# Patient Record
Sex: Male | Born: 1935 | Race: White | Hispanic: No | Marital: Married | Smoking: Never smoker
Health system: Southern US, Community
[De-identification: ages and names within clinical notes are randomized; demographics above are authoritative.]

## PROBLEM LIST (undated history)

## (undated) DIAGNOSIS — I10 Essential (primary) hypertension: Secondary | ICD-10-CM

## (undated) DIAGNOSIS — K5792 Diverticulitis of intestine, part unspecified, without perforation or abscess without bleeding: Secondary | ICD-10-CM

## (undated) DIAGNOSIS — H9190 Unspecified hearing loss, unspecified ear: Secondary | ICD-10-CM

## (undated) DIAGNOSIS — Z8719 Personal history of other diseases of the digestive system: Secondary | ICD-10-CM

## (undated) DIAGNOSIS — I499 Cardiac arrhythmia, unspecified: Secondary | ICD-10-CM

## (undated) HISTORY — PX: MOHS SURGERY: SHX181

## (undated) HISTORY — PX: COLONOSCOPY: SHX174

## (undated) HISTORY — PX: CHOLECYSTECTOMY: SHX55

---

## 2005-08-26 ENCOUNTER — Ambulatory Visit: Payer: Self-pay | Admitting: Unknown Physician Specialty

## 2006-12-14 ENCOUNTER — Ambulatory Visit: Payer: Self-pay | Admitting: Internal Medicine

## 2006-12-22 ENCOUNTER — Ambulatory Visit: Payer: Self-pay | Admitting: Internal Medicine

## 2007-06-15 ENCOUNTER — Ambulatory Visit: Payer: Self-pay | Admitting: Internal Medicine

## 2008-11-29 ENCOUNTER — Ambulatory Visit: Payer: Self-pay | Admitting: Cardiology

## 2008-12-06 ENCOUNTER — Ambulatory Visit: Payer: Self-pay | Admitting: Internal Medicine

## 2008-12-12 ENCOUNTER — Ambulatory Visit: Payer: Self-pay | Admitting: Cardiology

## 2014-01-09 ENCOUNTER — Emergency Department: Payer: Self-pay | Admitting: Emergency Medicine

## 2014-01-09 LAB — PROTIME-INR
INR: 2.3
Prothrombin Time: 25 secs — ABNORMAL HIGH (ref 11.5–14.7)

## 2014-01-17 ENCOUNTER — Ambulatory Visit: Payer: Self-pay | Admitting: Otolaryngology

## 2016-04-16 ENCOUNTER — Emergency Department: Payer: Medicare Other

## 2016-04-16 ENCOUNTER — Encounter: Payer: Self-pay | Admitting: *Deleted

## 2016-04-16 ENCOUNTER — Emergency Department
Admission: EM | Admit: 2016-04-16 | Discharge: 2016-04-16 | Disposition: A | Discharge status: Home / Self Care | Payer: Medicare Other | Attending: Emergency Medicine | Admitting: Emergency Medicine

## 2016-04-16 DIAGNOSIS — I10 Essential (primary) hypertension: Secondary | ICD-10-CM | POA: Insufficient documentation

## 2016-04-16 DIAGNOSIS — R079 Chest pain, unspecified: Secondary | ICD-10-CM

## 2016-04-16 DIAGNOSIS — Z79899 Other long term (current) drug therapy: Secondary | ICD-10-CM | POA: Diagnosis not present

## 2016-04-16 DIAGNOSIS — R0781 Pleurodynia: Secondary | ICD-10-CM | POA: Insufficient documentation

## 2016-04-16 DIAGNOSIS — Z7901 Long term (current) use of anticoagulants: Secondary | ICD-10-CM | POA: Diagnosis not present

## 2016-04-16 DIAGNOSIS — R0602 Shortness of breath: Secondary | ICD-10-CM | POA: Diagnosis present

## 2016-04-16 HISTORY — DX: Essential (primary) hypertension: I10

## 2016-04-16 LAB — BASIC METABOLIC PANEL
ANION GAP: 10 (ref 5–15)
BUN: 17 mg/dL (ref 6–20)
CALCIUM: 9.2 mg/dL (ref 8.9–10.3)
CHLORIDE: 102 mmol/L (ref 101–111)
CO2: 29 mmol/L (ref 22–32)
CREATININE: 1.04 mg/dL (ref 0.61–1.24)
GLUCOSE: 178 mg/dL — AB (ref 65–99)
Potassium: 3.4 mmol/L — ABNORMAL LOW (ref 3.5–5.1)
Sodium: 141 mmol/L (ref 135–145)

## 2016-04-16 LAB — CBC
HCT: 43.7 % (ref 40.0–52.0)
Hemoglobin: 15.2 g/dL (ref 13.0–18.0)
MCH: 31.1 pg (ref 26.0–34.0)
MCHC: 34.8 g/dL (ref 32.0–36.0)
MCV: 89.5 fL (ref 80.0–100.0)
PLATELETS: 185 10*3/uL (ref 150–440)
RBC: 4.88 MIL/uL (ref 4.40–5.90)
RDW: 13.7 % (ref 11.5–14.5)
WBC: 5.6 10*3/uL (ref 3.8–10.6)

## 2016-04-16 LAB — PROTIME-INR
INR: 2.56
PROTHROMBIN TIME: 27.2 s — AB (ref 11.4–15.0)

## 2016-04-16 LAB — TROPONIN I: Troponin I: <0.03 ng/mL (ref <0.031)

## 2016-04-16 MED ORDER — IOPAMIDOL (ISOVUE-370) INJECTION 76%
80.0000 mL | Freq: Once | INTRAVENOUS | Status: AC | PRN
  Administered 2016-04-16: 80 mL via INTRAVENOUS

## 2016-04-16 NOTE — ED Provider Notes (Signed)
[facility] Emergency Department Provider Note   ____________________________________________  Time seen: Approximately 9-53am I have reviewed the triage vital signs and the triage nursing note.  HISTORY  Chief Complaint Chest Pain and Shortness of Breath   Historian Patient, and son  HPI Jason Dawson is a 82 y.o. male currently living alone while wife is in acute care rehab from fracture, here for left sided sharp and intermittent chest pain, worse with breathing.  Had brunswick stew last night then went to a ball game with his son - after the game, walked up a hill and complained of left sided rib pain with breathing. Was improving upon arriving home.  At that point he also had some indigestion burping symptoms and tried tums.  Felt better.  This AM had pain return pleuritic to left lateral chest.  No other abd pain.  No fever, vomiting, bowel changes.  Recent URI/nasal congestion.  No significant cough.  Pt came because he was worried about his heart or pneumonia.  Cardiologist Dr. Darrold Junker, does not know if he's had a cardiac cath or a stress test before.  He does physical therapy and states he walks 2 miles and typically has no problem with this.    Past Medical History  Diagnosis Date  . Hypertension     There are no active problems to display for this patient.   History reviewed. No pertinent past surgical history.  Current Outpatient Rx  Name  Route  Sig  Dispense  Refill  . lisinopril-hydrochlorothiazide (PRINZIDE,ZESTORETIC) 10-12.5 MG tablet   Oral   Take 1 tablet by mouth daily.         . metoprolol (LOPRESSOR) 50 MG tablet   Oral   Take 50 mg by mouth 2 (two) times daily.         Marland Kitchen warfarin (COUMADIN) 2.5 MG tablet   Oral   Take 2.5 mg by mouth daily.           Allergies Review of patient's allergies indicates no known allergies.  History reviewed. No pertinent family history.  Social History Social History   Substance Use Topics  . Smoking status: Never Smoker   . Smokeless tobacco: None  . Alcohol Use: None    Review of Systems  Constitutional: Negative for fever. Eyes: Negative for visual changes. ENT: Negative for sore throat. Cardiovascular: Negative for palpitations. Respiratory: as per hpi. Gastrointestinal: Negative for abdominal pain, vomiting and diarrhea.  Some belching, gone currently Genitourinary: Negative for dysuria. Musculoskeletal: Negative for back pain. Skin: Negative for rash. Neurological: Negative for headache. 10 point Review of Systems otherwise negative ____________________________________________   PHYSICAL EXAM:  VITAL SIGNS: ED Triage Vitals  Enc Vitals Group     BP 04/16/16 0900 114/86 mmHg     Pulse Rate 04/16/16 0900 77     Resp 04/16/16 0900 19     Temp 04/16/16 0903 97.9 F (36.6 C)     Temp src --      SpO2 04/16/16 0900 99 %     Weight 04/16/16 0857 138 lb (62.596 kg)     Height 04/16/16 0857  (1.727 m)     Head Cir --      Peak Flow --      Pain Score 04/16/16 0857 3     Pain Loc --      Pain Edu? --      Excl. in GC? --      Constitutional: Alert and oriented. Well  appearing and in no distress. HEENT   Head: Normocephalic and atraumatic.      Eyes: Conjunctivae are normal. PERRL. Normal extraocular movements.      Ears:         Nose: No congestion/rhinnorhea.   Mouth/Throat: Mucous membranes are moist.   Neck: No stridor. Cardiovascular/Chest: Normal rate, regular rhythm.  No murmurs, rubs, or gallops.  No chest wall tenderness to ap or lateral compression. Respiratory: Normal respiratory effort without tachypnea nor retractions. Breath sounds are clear and equal bilaterally. No wheezes/rales/rhonchi. Gastrointestinal: Soft. No distention, no guarding, no rebound. Nontender.    Genitourinary/rectal:Deferred Musculoskeletal: Nontender with normal range of motion in all extremities. No joint effusions.  No lower  extremity tenderness.  No edema. Neurologic:  Normal speech and language. No gross or focal neurologic deficits are appreciated. Skin:  Skin is warm, dry and intact. No rash noted. Psychiatric: Mood and affect are normal. Speech and behavior are normal. Patient exhibits appropriate insight and judgment.  ____________________________________________   EKG I, Governor Rooks, MD, the attending physician have personally viewed and interpreted all ECGs.  80 bpm. Atrial fibrillation. Narrow QRS. Normal axis. Nonspecific T-wave                                                                                                                                                                                                                    Comparison with prior, similar nonspecific inf and lateral t wave findings.                                         ____________________________________________  LABS (pertinent positives/negatives)  Basic metabolic panel significant for potassium 3.4 White blood cell count 5.6, hemoglobin 15.2 and platelet count 185 Troponin less than 0.03 INR 2.56  ____________________________________________  RADIOLOGY All Xrays were viewed by me. Imaging interpreted by Radiologist.  Chest two-view: No acute cardio pulmonary abnormalities  Chest CT PE study:   IMPRESSION: 1. There is nodular prominence bilateral lobe of thyroid gland. Further correlation with thyroid gland ultrasound is recommended. 2. No mediastinal hematoma or adenopathy. 3. Atherosclerotic calcifications of thoracic aorta and coronary arteries. 4. No acute infiltrate or pulmonary edema. 5. No pulmonary embolus is noted. 6. Degenerative changes thoracic spine. 7. Status postcholecystectomy. 8. __________________________________________  PROCEDURES  Procedure(s) performed: None  Critical Care performed: None  ____________________________________________   ED COURSE / ASSESSMENT AND  PLAN  Pertinent labs & imaging results that were available during my care of the patient were reviewed by me and considered in my medical decision making (see chart for details).   Clinically unclear what source of discomfort might be -- the way it was described yesterday after the ball game, sounded like possible "side stitch" musculoskeletal pain that resolved.  However, was returned this morning and had more of a respiratory component.  It seems mostly resolved now.  Also have been some GI/GERD belching symptoms.  ACS unlikely based on atypical symptoms, and ECG similar to prior, and troponin negative/reassuring.  I will have him follow up with his cardiologist, or as referred to unassigned cardiologist.  CT scan is unremarkable for source of patient's pain including no PE or other cardio pulmonary findings.  There was bilateral thyroid prominence, which was discussed with the patient, which she should follow up with his primary care physician for possible thyroid ultrasound. In any case, I don't think that this is responsible for his discomfort.    CONSULTATIONS:    None   Patient / Family / Caregiver informed of clinical course, medical decision-making process, and agree with plan.   I discussed return precautions, follow-up instructions, and discharged instructions with patient and/or family.   ___________________________________________   FINAL CLINICAL IMPRESSION(S) / ED DIAGNOSES   Final diagnoses:  Chest pain, pleuritic  Chest pain, unspecified chest pain type              Note: This dictation was prepared with Dragon dictation. Any transcriptional errors that result from this process are unintentional   Governor Rooksebecca Lashanna Angelo, MD 04/16/16 1222

## 2016-04-16 NOTE — ED Notes (Signed)
States some SOB that began yesterday and left sided chest pain and arm pain that began yesterday, increased pain when taking deep breathes

## 2016-04-16 NOTE — ED Notes (Signed)
Pt verbalized understanding of discharge instructions. NAD at this time. 

## 2016-04-16 NOTE — Discharge Instructions (Signed)
You are evaluated for left-sided chest discomfort, and although no certain cause was found, and your exam and evaluation are reassuring in the emergency department today.  I would recommend follow-up with a cardiologist, if you do not have a cardiologist, Dr. Elissa HeftyHarding's office information is provided.  Return to the emergency department for any worsening condition including trouble breathing, fever, shortness of breath, abdominal pain, or any other symptoms concerning to you.   Nonspecific Chest Pain It is often hard to find the cause of chest pain. There is always a chance that your pain could be related to something serious, such as a heart attack or a blood clot in your lungs. Chest pain can also be caused by conditions that are not life-threatening. If you have chest pain, it is very important to follow up with your doctor.  HOME CARE  If you were prescribed an antibiotic medicine, finish it all even if you start to feel better.  Avoid any activities that cause chest pain.  Do not use any tobacco products, including cigarettes, chewing tobacco, or electronic cigarettes. If you need help quitting, ask your doctor.  Do not drink alcohol.  Take medicines only as told by your doctor.  Keep all follow-up visits as told by your doctor. This is important. This includes any further testing if your chest pain does not go away.  Your doctor may tell you to keep your head raised (elevated) while you sleep.  Make lifestyle changes as told by your doctor. These may include:  Getting regular exercise. Ask your doctor to suggest some activities that are safe for you.  Eating a heart-healthy diet. Your doctor or a diet specialist (dietitian) can help you to learn healthy eating options.  Maintaining a healthy weight.  Managing diabetes, if necessary.  Reducing stress. GET HELP IF:  Your chest pain does not go away, even after treatment.  You have a rash with blisters on your chest.  You  have a fever. GET HELP RIGHT AWAY IF:  Your chest pain is worse.  You have an increasing cough, or you cough up blood.  You have severe belly (abdominal) pain.  You feel extremely weak.  You pass out (faint).  You have chills.  You have sudden, unexplained chest discomfort.  You have sudden, unexplained discomfort in your arms, back, neck, or jaw.  You have shortness of breath at any time.  You suddenly start to sweat, or your skin gets clammy.  You feel nauseous.  You vomit.  You suddenly feel light-headed or dizzy.  Your heart begins to beat quickly, or it feels like it is skipping beats. These symptoms may be an emergency. Do not wait to see if the symptoms will go away. Get medical help right away. Call your local emergency services (911 in the [place] Do not drive yourself to the hospital.   This information is not intended to replace advice given to you by your health care provider. Make sure you discuss any questions you have with your health care provider.   Document Released: 05/19/2008 Document Revised: 12/22/2014 Document Reviewed: 07/07/2014 Elsevier Interactive Patient Education [employer]2016 Elsevier Inc.

## 2017-03-10 ENCOUNTER — Encounter: Payer: Self-pay | Admitting: *Deleted

## 2017-03-10 DIAGNOSIS — I252 Old myocardial infarction: Secondary | ICD-10-CM | POA: Diagnosis not present

## 2017-03-10 DIAGNOSIS — I10 Essential (primary) hypertension: Secondary | ICD-10-CM | POA: Diagnosis not present

## 2017-03-10 DIAGNOSIS — H2512 Age-related nuclear cataract, left eye: Secondary | ICD-10-CM | POA: Diagnosis present

## 2017-03-10 DIAGNOSIS — I4891 Unspecified atrial fibrillation: Secondary | ICD-10-CM | POA: Diagnosis not present

## 2017-03-10 DIAGNOSIS — K219 Gastro-esophageal reflux disease without esophagitis: Secondary | ICD-10-CM | POA: Diagnosis not present

## 2017-03-12 ENCOUNTER — Ambulatory Visit
Admission: RE | Admit: 2017-03-12 | Discharge: 2017-03-12 | Disposition: A | Discharge status: Home / Self Care | Payer: Medicare Other | Source: Ambulatory Visit | Attending: Ophthalmology | Admitting: Ophthalmology

## 2017-03-12 ENCOUNTER — Ambulatory Visit: Payer: Medicare Other | Admitting: Anesthesiology

## 2017-03-12 ENCOUNTER — Encounter
Admission: RE | Disposition: A | Discharge status: Home / Self Care | Payer: Self-pay | Source: Ambulatory Visit | Attending: Ophthalmology

## 2017-03-12 ENCOUNTER — Encounter: Payer: Self-pay | Admitting: *Deleted

## 2017-03-12 DIAGNOSIS — H2512 Age-related nuclear cataract, left eye: Secondary | ICD-10-CM | POA: Diagnosis not present

## 2017-03-12 DIAGNOSIS — I252 Old myocardial infarction: Secondary | ICD-10-CM | POA: Insufficient documentation

## 2017-03-12 DIAGNOSIS — K219 Gastro-esophageal reflux disease without esophagitis: Secondary | ICD-10-CM | POA: Insufficient documentation

## 2017-03-12 DIAGNOSIS — I4891 Unspecified atrial fibrillation: Secondary | ICD-10-CM | POA: Insufficient documentation

## 2017-03-12 DIAGNOSIS — I10 Essential (primary) hypertension: Secondary | ICD-10-CM | POA: Insufficient documentation

## 2017-03-12 HISTORY — DX: Cardiac arrhythmia, unspecified: I49.9

## 2017-03-12 HISTORY — PX: CATARACT EXTRACTION W/PHACO: SHX586

## 2017-03-12 HISTORY — DX: Diverticulitis of intestine, part unspecified, without perforation or abscess without bleeding: K57.92

## 2017-03-12 HISTORY — DX: Personal history of other diseases of the digestive system: Z87.19

## 2017-03-12 HISTORY — DX: Unspecified hearing loss, unspecified ear: H91.90

## 2017-03-12 SURGERY — PHACOEMULSIFICATION, CATARACT, WITH IOL INSERTION
Anesthesia: Monitor Anesthesia Care | Site: Eye | Laterality: Left | Wound class: Clean

## 2017-03-12 MED ORDER — NEOMYCIN-POLYMYXIN-DEXAMETH 0.1 % OP OINT
TOPICAL_OINTMENT | OPHTHALMIC | Status: DC | PRN
  Administered 2017-03-12: 1 via OPHTHALMIC

## 2017-03-12 MED ORDER — MOXIFLOXACIN HCL 0.5 % OP SOLN
1.0000 [drp] | OPHTHALMIC | Status: AC
  Administered 2017-03-12 (×3): 1 [drp] via OPHTHALMIC

## 2017-03-12 MED ORDER — SODIUM CHLORIDE 0.9 % IV SOLN
INTRAVENOUS | Status: DC
  Administered 2017-03-12: 09:00:00 via INTRAVENOUS

## 2017-03-12 MED ORDER — EPINEPHRINE PF 1 MG/ML IJ SOLN
INTRAOCULAR | Status: DC | PRN
  Administered 2017-03-12: 200 mL via OPHTHALMIC

## 2017-03-12 MED ORDER — CARBACHOL 0.01 % IO SOLN
INTRAOCULAR | Status: DC | PRN
  Administered 2017-03-12: 0.5 mL via INTRAOCULAR

## 2017-03-12 MED ORDER — NA HYALUR & NA CHOND-NA HYALUR 0.4-0.35 ML IO KIT
PACK | INTRAOCULAR | Status: DC | PRN
  Administered 2017-03-12: .35 mL via INTRAOCULAR

## 2017-03-12 MED ORDER — ARMC OPHTHALMIC DILATING DROPS
OPHTHALMIC | Status: AC
  Administered 2017-03-12: 1 via OPHTHALMIC
  Filled 2017-03-12: qty 0.4

## 2017-03-12 MED ORDER — LIDOCAINE HCL (PF) 4 % IJ SOLN
INTRAOCULAR | Status: DC | PRN
  Administered 2017-03-12: 4 mL via OPHTHALMIC

## 2017-03-12 MED ORDER — FENTANYL CITRATE (PF) 100 MCG/2ML IJ SOLN
INTRAMUSCULAR | Status: DC | PRN
Start: 2017-03-12 — End: 2017-03-12
  Administered 2017-03-12 (×2): 25 ug via INTRAVENOUS

## 2017-03-12 MED ORDER — ARMC OPHTHALMIC DILATING DROPS
1.0000 "application " | OPHTHALMIC | Status: AC
  Administered 2017-03-12 (×3): 1 via OPHTHALMIC

## 2017-03-12 MED ORDER — MOXIFLOXACIN HCL 0.5 % OP SOLN
OPHTHALMIC | Status: AC
  Administered 2017-03-12: 1 [drp] via OPHTHALMIC
  Filled 2017-03-12: qty 3

## 2017-03-12 MED ORDER — MIDAZOLAM HCL 2 MG/2ML IJ SOLN
INTRAMUSCULAR | Status: AC
  Filled 2017-03-12: qty 2

## 2017-03-12 MED ORDER — FENTANYL CITRATE (PF) 100 MCG/2ML IJ SOLN
INTRAMUSCULAR | Status: AC
  Filled 2017-03-12: qty 2

## 2017-03-12 MED ORDER — MIDAZOLAM HCL 2 MG/2ML IJ SOLN
INTRAMUSCULAR | Status: DC | PRN
  Administered 2017-03-12: .5 mg via INTRAVENOUS
  Administered 2017-03-12: 1 mg via INTRAVENOUS

## 2017-03-12 SURGICAL SUPPLY — 23 items
CANNULA ANT/CHMB 27GA (MISCELLANEOUS) ×6 IMPLANT
CUP MEDICINE 2OZ PLAST GRAD ST (MISCELLANEOUS) ×3 IMPLANT
GLOVE BIO SURGEON STRL SZ8 (GLOVE) ×3 IMPLANT
GLOVE BIOGEL M 6.5 STRL (GLOVE) ×3 IMPLANT
GLOVE SURG LX 7.5 STRW (GLOVE) ×2
GLOVE SURG LX STRL 7.5 STRW (GLOVE) ×1 IMPLANT
GOWN STRL REUS W/ TWL LRG LVL3 (GOWN DISPOSABLE) ×2 IMPLANT
GOWN STRL REUS W/TWL LRG LVL3 (GOWN DISPOSABLE) ×4
LENS IOL ACRSF IQ TRC 6 21.5 ×1 IMPLANT
LENS IOL ACRYSOF IQ TORIC 21.5 ×2 IMPLANT
LENS IOL IQ TORIC 6 21.5 ×1 IMPLANT
PACK CATARACT (MISCELLANEOUS) ×3 IMPLANT
PACK CATARACT BRASINGTON LX (MISCELLANEOUS) ×3 IMPLANT
PACK EYE AFTER SURG (MISCELLANEOUS) ×3 IMPLANT
SOL BSS BAG (MISCELLANEOUS) ×3
SOL PREP PVP 2OZ (MISCELLANEOUS) ×3
SOLUTION BSS BAG (MISCELLANEOUS) ×1 IMPLANT
SOLUTION PREP PVP 2OZ (MISCELLANEOUS) ×1 IMPLANT
SYR 3ML LL SCALE MARK (SYRINGE) ×3 IMPLANT
SYR 5ML LL (SYRINGE) ×3 IMPLANT
SYR TB 1ML 27GX1/2 LL (SYRINGE) ×3 IMPLANT
WATER STERILE IRR 250ML POUR (IV SOLUTION) ×3 IMPLANT
WIPE NON LINTING 3.25X3.25 (MISCELLANEOUS) ×3 IMPLANT

## 2017-03-12 NOTE — Transfer of Care (Signed)
Immediate Anesthesia Transfer of Care Note  Patient: SANCHEZ HEMMER  Procedure(s) Performed: Procedure(s) with comments: CATARACT EXTRACTION PHACO AND INTRAOCULAR LENS PLACEMENT (IOC) (Left) - US01:35.7 AP%26.2 CDE25.15 LOT # [id number]  Patient Location: PACU  Anesthesia Type:MAC  Level of Consciousness: awake, alert  and oriented  Airway & Oxygen Therapy: Patient Spontanous Breathing  Post-op Assessment: Report given to RN and Post -op Vital signs reviewed and stable  Post vital signs: Reviewed and stable  Last Vitals:  Vitals:   03/12/17 0847 03/12/17 1025  BP: 125/79 128/81  Pulse: (!) 55 (!) 59  Resp: 16 12  Temp: (!) 35.8 C (!) 36 C    Last Pain:  Vitals:   03/12/17 0847  TempSrc: Tympanic  PainSc: 0-No pain         Complications: No apparent anesthesia complications

## 2017-03-12 NOTE — Anesthesia Postprocedure Evaluation (Signed)
Anesthesia Post Note  Patient: JEET SHOUGH  Procedure(s) Performed: Procedure(s) (LRB): CATARACT EXTRACTION PHACO AND INTRAOCULAR LENS PLACEMENT (IOC) (Left)  Patient location during evaluation: PACU Anesthesia Type: MAC Level of consciousness: awake, awake and alert and oriented Pain management: pain level controlled Vital Signs Assessment: post-procedure vital signs reviewed and stable Respiratory status: spontaneous breathing Cardiovascular status: blood pressure returned to baseline Postop Assessment: no signs of nausea or vomiting Anesthetic complications: no     Last Vitals:  Vitals:   03/12/17 0847 03/12/17 1025  BP: 125/79 128/81  Pulse: (!) 55 (!) 59  Resp: 16 12  Temp: (!) 35.8 C (!) 36 C    Last Pain:  Vitals:   03/12/17 0847  TempSrc: Tympanic  PainSc: 0-No pain                 Maikel Neisler Lorenza Chick

## 2017-03-12 NOTE — Anesthesia Preprocedure Evaluation (Signed)
Anesthesia Evaluation  Patient identified by MRN, date of birth, ID band Patient awake    Reviewed: Allergy & Precautions, H&P , NPO status , Patient's Chart, lab work & pertinent test results  Airway Mallampati: III  TM Distance: <3 FB Neck ROM: limited    Dental  (+) Poor Dentition, Chipped, Caps   Pulmonary neg pulmonary ROS, neg shortness of breath,    Pulmonary exam normal breath sounds clear to auscultation       Cardiovascular Exercise Tolerance: Good hypertension, (-) Past MI Normal cardiovascular exam+ dysrhythmias Atrial Fibrillation  Rhythm:regular Rate:Normal     Neuro/Psych negative neurological ROS  negative psych ROS   GI/Hepatic negative GI ROS, Neg liver ROS, neg GERD  ,  Endo/Other  negative endocrine ROS  Renal/GU      Musculoskeletal   Abdominal   Peds  Hematology negative hematology ROS (+)   Anesthesia Other Findings Past Medical History: No date: Diverticulitis No date: Dysrhythmia     Comment: atrial fibrillation No date: History of diverticulosis No date: HOH (hard of hearing) No date: Hypertension  Past Surgical History: No date: CHOLECYSTECTOMY No date: COLONOSCOPY No date: MOHS SURGERY  BMI    Body Mass Index:  25.39 kg/m      Reproductive/Obstetrics negative OB ROS                             Anesthesia Physical Anesthesia Plan  ASA: III  Anesthesia Plan: MAC   Post-op Pain Management:    Induction:   Airway Management Planned:   Additional Equipment:   Intra-op Plan:   Post-operative Plan:   Informed Consent: I have reviewed the patients History and Physical, chart, labs and discussed the procedure including the risks, benefits and alternatives for the proposed anesthesia with the patient or authorized representative who has indicated his/her understanding and acceptance.     Plan Discussed with: Anesthesiologist, CRNA and  Surgeon  Anesthesia Plan Comments:         Anesthesia Quick Evaluation

## 2017-03-12 NOTE — H&P (Signed)
The History and Physical notes are on paper, have been signed, and are to be scanned. The patient remains stable and unchanged from the H&P.   Previous H&P reviewed, patient examined, and there are no changes.  Jason Dawson 03/12/2017 8:39 AM

## 2017-03-12 NOTE — Anesthesia Procedure Notes (Signed)
Procedure Name: MAC Date/Time: 03/12/2017 9:50 AM Performed by: Hedda Slade Pre-anesthesia Checklist: Patient identified, Emergency Drugs available, Suction available and Patient being monitored Oxygen Delivery Method: Nasal cannula

## 2017-03-12 NOTE — Op Note (Signed)
LOCATION:  [facility]   PREOPERATIVE DIAGNOSIS:  Nuclear sclerotic cataract of the left eye.  H25.12  POSTOPERATIVE DIAGNOSIS:  Nuclear sclerotic cataract of the left eye.   PROCEDURE:  Phacoemulsification with Toric posterior chamber intraocular lens placement of the left eye.   LENS:  Implant Name Type Inv. Item Serial No. Manufacturer Lot No. LRB No. Used  LENS IOL TORIC 21.5 - [id number]   LENS IOL TORIC 21.5 [id number] ALCON   Left 1   Toric intraocular lens with 3.75 diopters of cylindrical power with axis orientation at 169 degrees.   ULTRASOUND TIME: 26 % of 1 minutes, 36 seconds.  CDE 25   SURGEON:  Deirdre Evenerhadwick R. Jensyn Cambria, MD   ANESTHESIA:  Topical with tetracaine drops and 2% Xylocaine jelly, augmented with 1% preservative-free intracameral lidocaine.  COMPLICATIONS:  None.   DESCRIPTION OF PROCEDURE:  The patient was identified in the holding room and transported to the operating suite and placed in the supine position under the operating microscope.  The left eye was identified as the operative eye, and it was prepped and draped in the usual sterile ophthalmic fashion.    A clear-corneal paracentesis incision was made at the 1:30 position.  0.5 ml of preservative-free 1% lidocaine was injected into the anterior chamber. The anterior chamber was filled with Viscoat.  A 2.4 millimeter near clear corneal incision was then made at the 10:30 position.  A cystotome and capsulorrhexis forceps were then used to make a curvilinear capsulorrhexis.  Hydrodissection and hydrodelineation were then performed using balanced salt solution.   Phacoemulsification was then used in stop and chop fashion to remove the lens, nucleus and epinucleus.  The remaining cortex was aspirated using the irrigation and aspiration handpiece.  Provisc viscoelastic was then placed into the capsular bag to distend it for lens placement.  The Verion digital marker was used to align the implant at the  intended axis.   A 21.5 diopter lens was then injected into the capsular bag.  It was rotated clockwise until the axis marks on the lens were approximately 15 degrees in the counterclockwise direction to the intended alignment.  The viscoelastic was aspirated from the eye using the irrigation aspiration handpiece.  Then, a Koch spatula through the sideport incision was used to rotate the lens in a clockwise direction until the axis markings of the intraocular lens were lined up with the Verion alignment.  Balanced salt solution was then used to hydrate the wounds. Vigamox 0.2 ml of a 1mg  per ml solution was injected into the anterior chamber for a dose of 0.2 mg of intracameral antibiotic at the completion of the case.    The eye was noted to have a physiologic pressure and there was no wound leak noted.   Timolol and Brimonidine drops were applied to the eye.  The patient was taken to the recovery room in stable condition having had no complications of anesthesia or surgery.  Savita Runner 03/12/2017, 10:23 AM

## 2017-03-12 NOTE — Anesthesia Post-op Follow-up Note (Cosign Needed)
Anesthesia QCDR form completed.        

## 2017-03-12 NOTE — Discharge Instructions (Signed)
Eye Surgery Discharge Instructions  Expect mild scratchy sensation or mild soreness. DO NOT RUB YOUR EYE!  The day of surgery:  Minimal physical activity, but bed rest is not required  No reading, computer work, or close hand work  No bending, lifting, or straining.  May watch TV  For 24 hours:  No driving, legal decisions, or alcoholic beverages  Safety precautions  Eat anything you prefer: It is better to start with liquids, then soup then solid foods.  _____ Eye patch should be worn until postoperative exam tomorrow.  ____ Solar shield eyeglasses should be worn for comfort in the sunlight/patch while sleeping  Resume all regular medications including aspirin or Coumadin if these were discontinued prior to surgery. You may shower, bathe, shave, or wash your hair. Tylenol may be taken for mild discomfort.  Call your doctor if you experience significant pain, nausea, or vomiting, fever > 101 or other signs of infection. [phone] or [phone] Specific instructions:  Follow-up Information    Lockie MolaBRASINGTON,CHADWICK, MD. Go on 03/12/2017.   Specialty:  Ophthalmology Why:  3:40pm Contact information: [address]   [place] [address] [phone]

## 2017-03-13 ENCOUNTER — Encounter: Payer: Self-pay | Admitting: Ophthalmology

## 2017-04-09 ENCOUNTER — Encounter: Payer: Self-pay | Admitting: *Deleted

## 2017-04-16 ENCOUNTER — Ambulatory Visit: Payer: Medicare Other | Admitting: Anesthesiology

## 2017-04-16 ENCOUNTER — Encounter: Payer: Self-pay | Admitting: *Deleted

## 2017-04-16 ENCOUNTER — Ambulatory Visit
Admission: RE | Admit: 2017-04-16 | Discharge: 2017-04-16 | Disposition: A | Discharge status: Home / Self Care | Payer: Medicare Other | Source: Ambulatory Visit | Attending: Ophthalmology | Admitting: Ophthalmology

## 2017-04-16 ENCOUNTER — Encounter
Admission: RE | Disposition: A | Discharge status: Home / Self Care | Payer: Self-pay | Source: Ambulatory Visit | Attending: Ophthalmology

## 2017-04-16 DIAGNOSIS — H2511 Age-related nuclear cataract, right eye: Secondary | ICD-10-CM | POA: Insufficient documentation

## 2017-04-16 DIAGNOSIS — I10 Essential (primary) hypertension: Secondary | ICD-10-CM | POA: Diagnosis not present

## 2017-04-16 DIAGNOSIS — H5703 Miosis: Secondary | ICD-10-CM | POA: Diagnosis not present

## 2017-04-16 DIAGNOSIS — I4891 Unspecified atrial fibrillation: Secondary | ICD-10-CM | POA: Insufficient documentation

## 2017-04-16 HISTORY — PX: CATARACT EXTRACTION W/PHACO: SHX586

## 2017-04-16 SURGERY — PHACOEMULSIFICATION, CATARACT, WITH IOL INSERTION
Anesthesia: Monitor Anesthesia Care | Site: Eye | Laterality: Right | Wound class: Clean

## 2017-04-16 MED ORDER — LIDOCAINE HCL (PF) 4 % IJ SOLN
INTRAMUSCULAR | Status: AC
  Filled 2017-04-16: qty 5

## 2017-04-16 MED ORDER — SODIUM CHLORIDE 0.9 % IV SOLN
INTRAVENOUS | Status: DC
  Administered 2017-04-16: 09:00:00 via INTRAVENOUS

## 2017-04-16 MED ORDER — FENTANYL CITRATE (PF) 100 MCG/2ML IJ SOLN
INTRAMUSCULAR | Status: DC | PRN
  Administered 2017-04-16: 50 ug via INTRAVENOUS

## 2017-04-16 MED ORDER — FENTANYL CITRATE (PF) 100 MCG/2ML IJ SOLN
INTRAMUSCULAR | Status: AC
  Filled 2017-04-16: qty 2

## 2017-04-16 MED ORDER — NA HYALUR & NA CHOND-NA HYALUR 0.4-0.35 ML IO KIT
PACK | INTRAOCULAR | Status: DC | PRN
  Administered 2017-04-16: .35 mL via INTRAOCULAR

## 2017-04-16 MED ORDER — CARBACHOL 0.01 % IO SOLN
INTRAOCULAR | Status: DC | PRN
  Administered 2017-04-16: 0.5 mL via INTRAOCULAR

## 2017-04-16 MED ORDER — ARMC OPHTHALMIC DILATING DROPS
OPHTHALMIC | Status: AC
  Filled 2017-04-16: qty 0.4

## 2017-04-16 MED ORDER — EPINEPHRINE PF 1 MG/ML IJ SOLN
INTRAMUSCULAR | Status: AC
  Filled 2017-04-16: qty 2

## 2017-04-16 MED ORDER — NEOMYCIN-POLYMYXIN-DEXAMETH 0.1 % OP OINT
TOPICAL_OINTMENT | OPHTHALMIC | Status: DC | PRN
  Administered 2017-04-16: 1 via OPHTHALMIC

## 2017-04-16 MED ORDER — EPINEPHRINE PF 1 MG/ML IJ SOLN
INTRAOCULAR | Status: DC | PRN
  Administered 2017-04-16: 10:00:00 via OPHTHALMIC

## 2017-04-16 MED ORDER — ARMC OPHTHALMIC DILATING DROPS
1.0000 "application " | OPHTHALMIC | Status: AC
  Administered 2017-04-16 (×3): 1 via OPHTHALMIC

## 2017-04-16 MED ORDER — MOXIFLOXACIN HCL 0.5 % OP SOLN
1.0000 [drp] | OPHTHALMIC | Status: AC
  Administered 2017-04-16 (×2): 1 [drp] via OPHTHALMIC

## 2017-04-16 MED ORDER — MOXIFLOXACIN HCL 0.5 % OP SOLN
OPHTHALMIC | Status: AC
  Filled 2017-04-16: qty 3

## 2017-04-16 MED ORDER — MOXIFLOXACIN HCL 0.5 % OP SOLN
OPHTHALMIC | Status: DC | PRN
  Administered 2017-04-16: 0.2 mL via OPHTHALMIC

## 2017-04-16 MED ORDER — CEFUROXIME OPHTHALMIC INJECTION 1 MG/0.1 ML
INJECTION | OPHTHALMIC | Status: AC
  Filled 2017-04-16: qty 0.1

## 2017-04-16 MED ORDER — BSS IO SOLN
INTRAOCULAR | Status: DC | PRN
  Administered 2017-04-16: 4 mL via OPHTHALMIC

## 2017-04-16 MED ORDER — NEOMYCIN-POLYMYXIN-DEXAMETH 3.5-10000-0.1 OP OINT
TOPICAL_OINTMENT | OPHTHALMIC | Status: AC
  Filled 2017-04-16: qty 3.5

## 2017-04-16 MED ORDER — POVIDONE-IODINE 5 % OP SOLN
OPHTHALMIC | Status: AC
  Filled 2017-04-16: qty 30

## 2017-04-16 MED ORDER — POVIDONE-IODINE 5 % OP SOLN
OPHTHALMIC | Status: DC | PRN
  Administered 2017-04-16: 1 via OPHTHALMIC

## 2017-04-16 MED ORDER — NA HYALUR & NA CHOND-NA HYALUR 0.55-0.5 ML IO KIT
PACK | INTRAOCULAR | Status: AC
  Filled 2017-04-16: qty 1.05

## 2017-04-16 SURGICAL SUPPLY — 25 items
CANNULA ANT/CHMB 27GA (MISCELLANEOUS) ×3 IMPLANT
CUP MEDICINE 2OZ PLAST GRAD ST (MISCELLANEOUS) ×3 IMPLANT
GLOVE BIO SURGEON STRL SZ8 (GLOVE) ×3 IMPLANT
GLOVE BIOGEL M 6.5 STRL (GLOVE) ×3 IMPLANT
GLOVE SURG LX 7.5 STRW (GLOVE) ×2
GLOVE SURG LX STRL 7.5 STRW (GLOVE) ×1 IMPLANT
GOWN STRL REUS W/ TWL LRG LVL3 (GOWN DISPOSABLE) ×2 IMPLANT
GOWN STRL REUS W/TWL LRG LVL3 (GOWN DISPOSABLE) ×4
LENS IOL ACRSF IQ TRC 6 21.5 ×1 IMPLANT
LENS IOL ACRYSOF IQ TORIC 21.5 ×2 IMPLANT
LENS IOL IQ TORIC 6 21.5 ×1 IMPLANT
PACK CATARACT (MISCELLANEOUS) ×3 IMPLANT
PACK CATARACT BRASINGTON LX (MISCELLANEOUS) ×3 IMPLANT
PACK EYE AFTER SURG (MISCELLANEOUS) ×3 IMPLANT
RING MALYGIN (MISCELLANEOUS) ×3 IMPLANT
SOL BSS BAG (MISCELLANEOUS) ×3
SOL PREP PVP 2OZ (MISCELLANEOUS) ×3
SOLUTION BSS BAG (MISCELLANEOUS) ×1 IMPLANT
SOLUTION PREP PVP 2OZ (MISCELLANEOUS) ×1 IMPLANT
SPEAR PVA EYE SURG (MISCELLANEOUS) ×3 IMPLANT
SYR 3ML LL SCALE MARK (SYRINGE) ×3 IMPLANT
SYR 5ML LL (SYRINGE) ×3 IMPLANT
SYR TB 1ML 27GX1/2 LL (SYRINGE) ×3 IMPLANT
WATER STERILE IRR 250ML POUR (IV SOLUTION) ×3 IMPLANT
WIPE NON LINTING 3.25X3.25 (MISCELLANEOUS) ×3 IMPLANT

## 2017-04-16 NOTE — Anesthesia Preprocedure Evaluation (Signed)
Anesthesia Evaluation  Patient identified by MRN, date of birth, ID band Patient awake    Reviewed: Allergy & Precautions, H&P , NPO status , Patient's Chart, lab work & pertinent test results  Airway Mallampati: III  TM Distance: <3 FB Neck ROM: limited    Dental  (+) Poor Dentition, Chipped, Caps   Pulmonary neg pulmonary ROS, neg shortness of breath,    Pulmonary exam normal breath sounds clear to auscultation       Cardiovascular Exercise Tolerance: Good hypertension, (-) Past MI Normal cardiovascular exam+ dysrhythmias Atrial Fibrillation  Rhythm:regular Rate:Normal     Neuro/Psych negative neurological ROS  negative psych ROS   GI/Hepatic negative GI ROS, Neg liver ROS, neg GERD  ,  Endo/Other  negative endocrine ROS  Renal/GU      Musculoskeletal   Abdominal   Peds  Hematology negative hematology ROS (+)   Anesthesia Other Findings Past Medical History: No date: Diverticulitis No date: Dysrhythmia     Comment: atrial fibrillation No date: History of diverticulosis No date: HOH (hard of hearing) No date: Hypertension  Past Surgical History: No date: CHOLECYSTECTOMY No date: COLONOSCOPY No date: MOHS SURGERY  BMI    Body Mass Index:  25.39 kg/m      Reproductive/Obstetrics negative OB ROS                             Anesthesia Physical  Anesthesia Plan  ASA: III  Anesthesia Plan: MAC   Post-op Pain Management:    Induction:   Airway Management Planned:   Additional Equipment:   Intra-op Plan:   Post-operative Plan:   Informed Consent: I have reviewed the patients History and Physical, chart, labs and discussed the procedure including the risks, benefits and alternatives for the proposed anesthesia with the patient or authorized representative who has indicated his/her understanding and acceptance.     Plan Discussed with: Anesthesiologist, CRNA and  Surgeon  Anesthesia Plan Comments:         Anesthesia Quick Evaluation

## 2017-04-16 NOTE — Op Note (Signed)
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   PROCEDURE:  Phacoemulsification with Toric posterior chamber intraocular lens placement of the right eye with placement of Malyugin pupil expansion device.Marland Kitchen.   LENS:   Implant Name Type Inv. Item Serial No. Manufacturer Lot No. LRB No. Used  LENS IOL ACRYSOF IQ 21.5 - [id number] Intraocular Lens LENS IOL ACRYSOF IQ 21.5 [id number] ALCON   Right 1     [id number] 21.5 D Toric intraocular lens with 3.75 diopters of cylindrical power with axis orientation at 24 degrees.   ULTRASOUND TIME: 25 % of 1 minutes, 20 seconds.  CDE 20.4   SURGEON:  Deirdre Evenerhadwick R. Kingslee Mairena, MD   ANESTHESIA: Topical with tetracaine drops and 2% Xylocaine jelly, augmented with 1% preservative-free intracameral lidocaine. .   COMPLICATIONS:  None.   DESCRIPTION OF PROCEDURE:  The patient was identified in the holding room and transported to the operating suite and placed in the supine position under the operating microscope.  The right eye was identified as the operative eye, and it was prepped and draped in the usual sterile ophthalmic fashion.    A clear-corneal paracentesis incision was made at the 12:00 position.  0.5 ml of preservative-free 1% lidocaine was injected into the anterior chamber. The anterior chamber was filled with Viscoat.  A 2.4 millimeter near clear corneal incision was then made at the 9:00 position.  A malyugin device was placed to expand the pupil to 6 mm for the remainder of the case.   A cystotome and capsulorrhexis forceps were then used to make a curvilinear capsulorrhexis.  Hydrodissection and hydrodelineation were then performed using balanced salt solution.   Phacoemulsification was then used in stop and chop fashion to remove the lens, nucleus and epinucleus.  The  remaining cortex was aspirated using the irrigation and aspiration handpiece.  Provisc viscoelastic was then placed into the capsular bag to distend it for lens placement.  The Verion digital marker was used to align the implant at the intended axis.   A Toric lens was then injected into the capsular bag.  It was rotated clockwise until the axis marks on the lens were approximately 15 degrees in the counterclockwise direction to the intended alignment.  The Malyugin ring was removed.  The viscoelastic was aspirated from the eye using the irrigation aspiration handpiece.  Then, a Koch spatula through the sideport incision was used to rotate the lens in a clockwise direction until the axis markings of the intraocular lens were lined up with the Verion alignment.  Balanced salt solution was then used to hydrate the wounds. Vigamox 0.2 ml of a 1mg  per ml solution was injected into the anterior chamber for a dose of 0.2 mg of intracameral antibiotic at the completion of the case.    The eye was noted to have a physiologic pressure and there was no wound leak noted.   Timolol and Brimonidine drops were applied to the eye.  The patient was taken to the recovery room in stable condition having had no complications of anesthesia or surgery.  Hawkins Seaman 04/16/2017, 10:24 AM

## 2017-04-16 NOTE — Anesthesia Post-op Follow-up Note (Cosign Needed)
Anesthesia QCDR form completed.        

## 2017-04-16 NOTE — Anesthesia Postprocedure Evaluation (Signed)
Anesthesia Post Note  Patient: Jason Dawson  Procedure(s) Performed: Procedure(s) (LRB): CATARACT EXTRACTION PHACO AND INTRAOCULAR LENS PLACEMENT (IOC) (Right)  Patient location during evaluation: Short Stay Anesthesia Type: MAC Level of consciousness: awake and alert and oriented Pain management: pain level controlled Vital Signs Assessment: post-procedure vital signs reviewed and stable Respiratory status: spontaneous breathing Cardiovascular status: stable Postop Assessment: no headache and adequate PO intake Anesthetic complications: no     Last Vitals:  Vitals:   04/16/17 0822 04/16/17 1027  BP: 123/77 140/79  Pulse: 76 (!) 52  Resp: 18 18  Temp: 36.7 C 36.5 C    Last Pain:  Vitals:   04/16/17 5852  TempSrc: Oral                 Lanora Manis

## 2017-04-16 NOTE — Discharge Instructions (Signed)
Eye Surgery Discharge Instructions ° °Expect mild scratchy sensation or mild soreness. °DO NOT RUB YOUR EYE! ° °The day of surgery: °• Minimal physical activity, but bed rest is not required °• No reading, computer work, or close hand work °• No bending, lifting, or straining. °• May watch TV ° °For 24 hours: °• No driving, legal decisions, or alcoholic beverages °• Safety precautions °• Eat anything you prefer: It is better to start with liquids, then soup then solid foods. °• _____ Eye patch should be worn until postoperative exam tomorrow. °• ____ Solar shield eyeglasses should be worn for comfort in the sunlight/patch while sleeping ° °Resume all regular medications including aspirin or Coumadin if these were discontinued prior to surgery. °You may shower, bathe, shave, or wash your hair. °Tylenol may be taken for mild discomfort. ° °Call your doctor if you experience significant pain, nausea, or vomiting, fever > 101 or other signs of infection. [phone] or [phone] °Specific instructions: ° °Follow-up Information   ° BRASINGTON,CHADWICK, MD Follow up on 04/16/2017.   °Specialty:  Ophthalmology °Why:  3:15 °Contact information: °[address]   °[place] [address] °[phone] ° ° °  °  °  ° °

## 2017-04-16 NOTE — Transfer of Care (Signed)
Immediate Anesthesia Transfer of Care Note  Patient: SHARAD VANEATON  Procedure(s) Performed: Procedure(s) with comments: CATARACT EXTRACTION PHACO AND INTRAOCULAR LENS PLACEMENT ([place]) (Right) - [place] 1:20.4 AP% 25.3 CDE 20.37 Fluid pack lot # [id number]  Patient Location: PACU and Short Stay  Anesthesia Type:MAC  Level of Consciousness: awake, alert  and oriented  Airway & Oxygen Therapy: Patient Spontanous Breathing  Post-op Assessment: Report given to RN and Post -op Vital signs reviewed and stable  Post vital signs: Reviewed and stable  Last Vitals:  Vitals:   04/16/17 0822 04/16/17 1027  BP: 123/77 140/79  Pulse: 76 (!) 52  Resp: 18 18  Temp: 36.7 C 36.5 C    Last Pain:  Vitals:   04/16/17 0822  TempSrc: Oral         Complications: No apparent anesthesia complications

## 2017-04-16 NOTE — H&P (Signed)
The History and Physical notes are on paper, have been signed, and are to be scanned. The patient remains stable and unchanged from the H&P.   Previous H&P reviewed, patient examined, and there are no changes.  Jason Dawson 04/16/2017 8:42 AM   

## 2017-04-22 ENCOUNTER — Encounter: Payer: Self-pay | Admitting: Ophthalmology

## 2017-05-25 IMAGING — CT CT ANGIO CHEST
1 of 2 series · 18 of 30 positions shown · IV contrast (isovue)
Comparison: Chest x-ray 04/16/2016

CLINICAL DATA: Left side chest pain for 2 days, chest discomfort

EXAM:
CT ANGIOGRAPHY CHEST WITH CONTRAST
TECHNIQUE: Multidetector CT imaging of the chest was performed using the
standard protocol during bolus administration of intravenous
contrast. Multiplanar CT image reconstructions and MIPs were
obtained to evaluate the vascular anatomy.
CONTRAST:  80 cc Isovue

[Series 5: pe 1.0 thins · axial · 0.72mm/px · z∈[-920,-662]mm · 18 of 291 slices shown]
[im 17/291  lung]
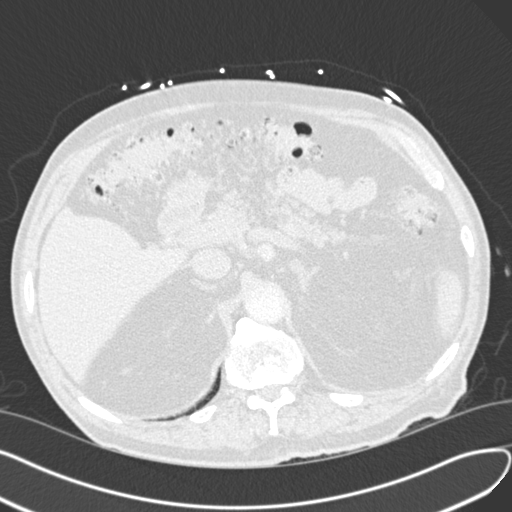
[im 33/291  mediastinal]
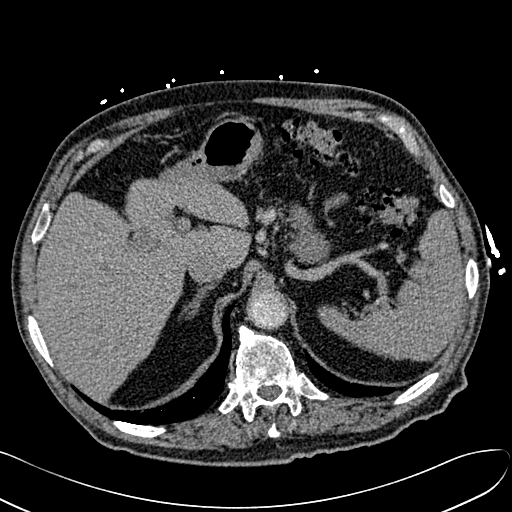
[im 49/291  lung]
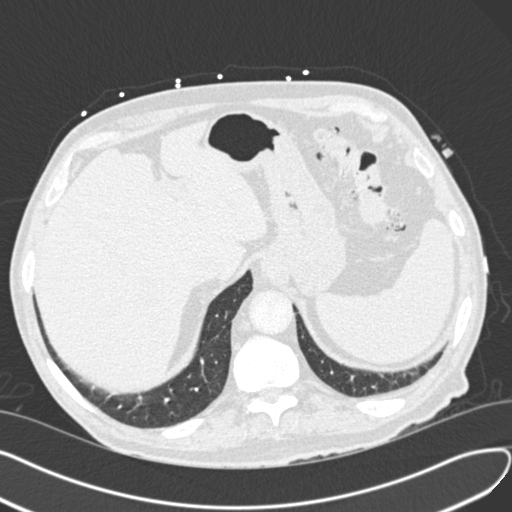
[im 65/291  mediastinal]
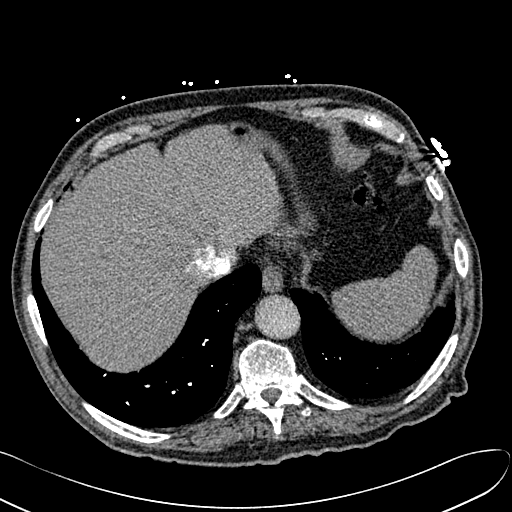
[im 81/291  lung]
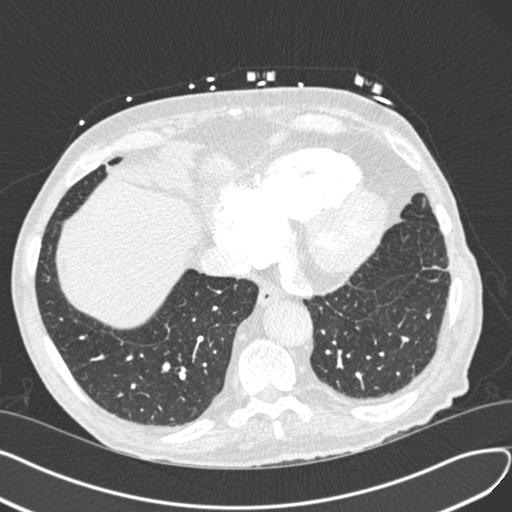
[im 97/291  mediastinal]
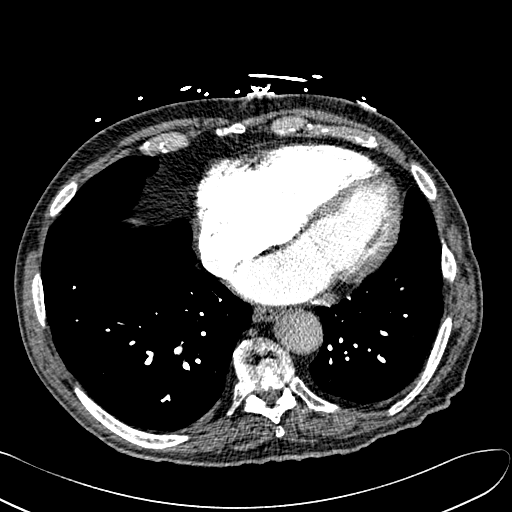
[im 113/291  lung]
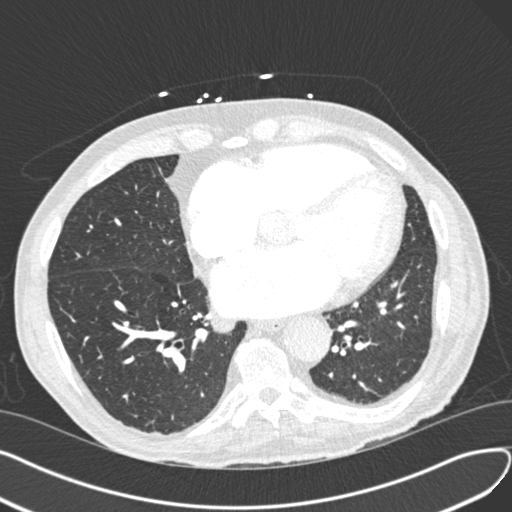
[im 129/291  mediastinal]
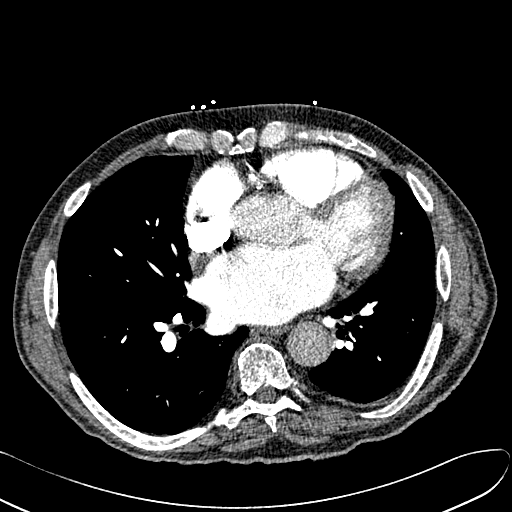
[im 136/291  lung]
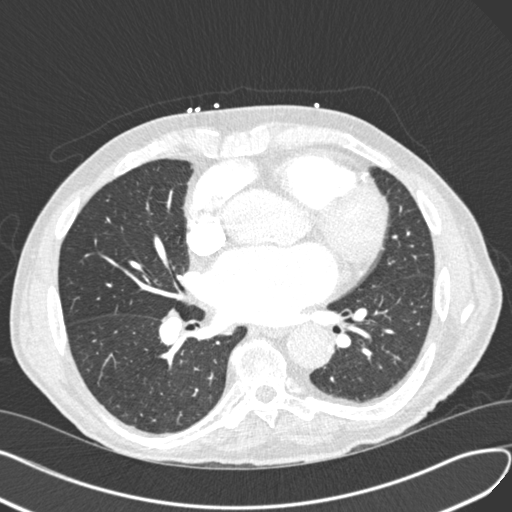
[im 146/291  mediastinal]
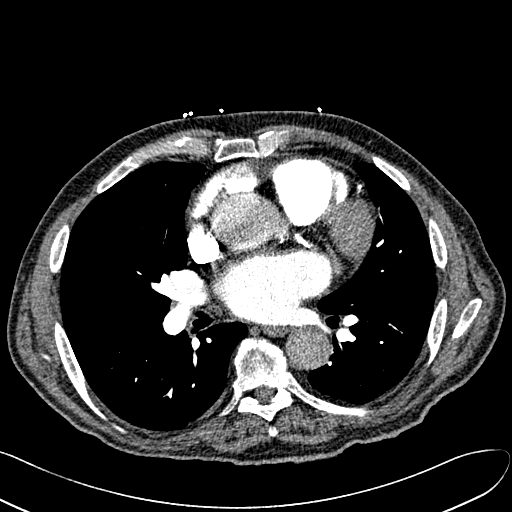
[im 162/291  lung]
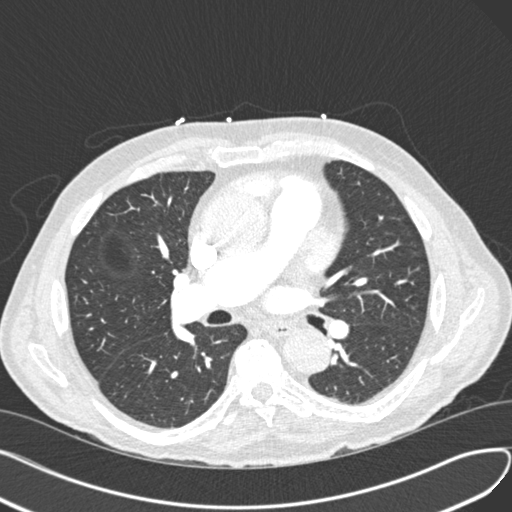
[im 178/291  mediastinal]
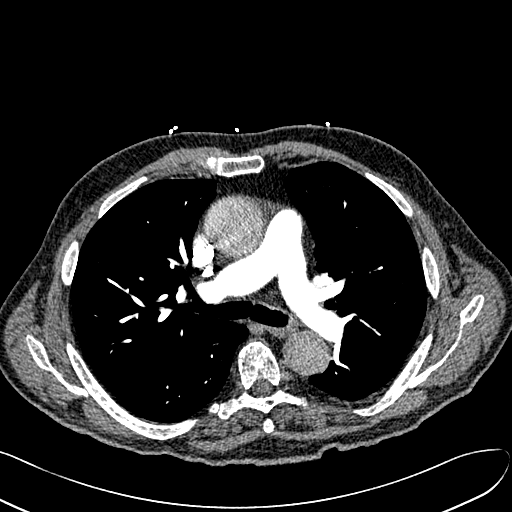
[im 194/291  lung]
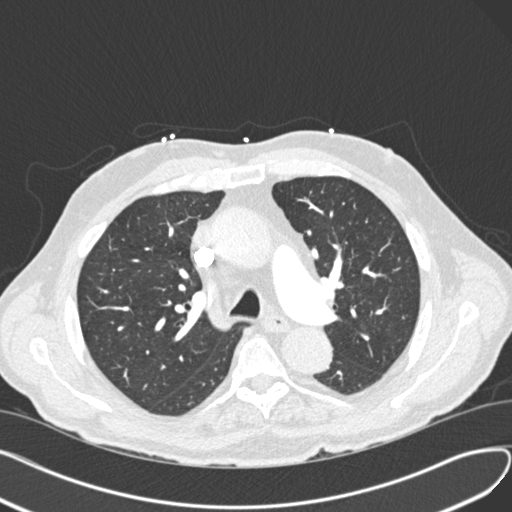
[im 210/291  mediastinal]
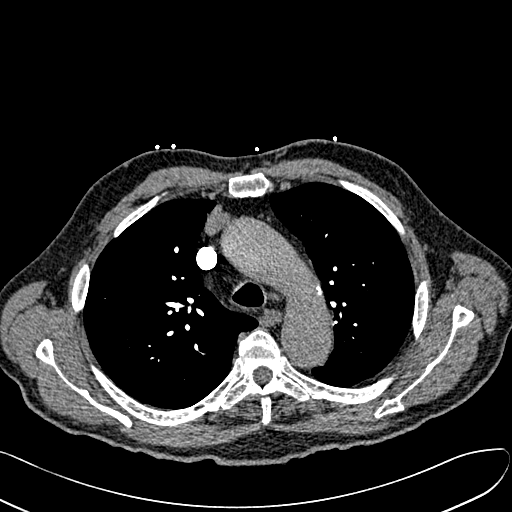
[im 226/291  lung]
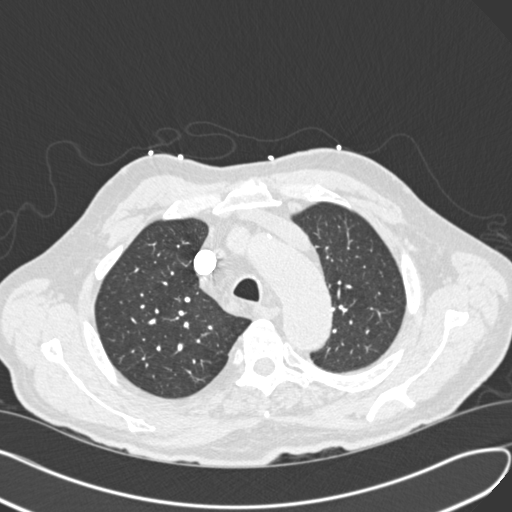
[im 242/291  mediastinal]
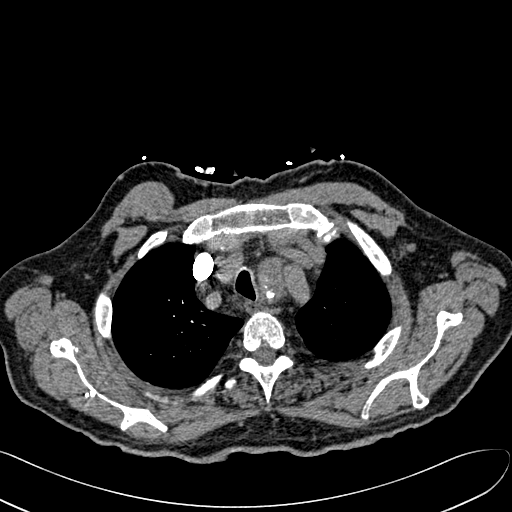
[im 258/291  lung]
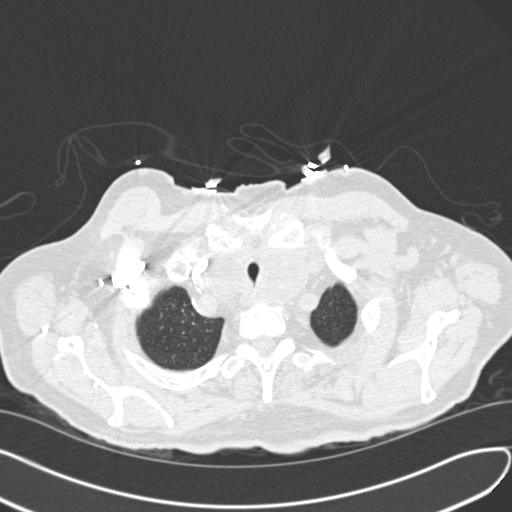
[im 274/291  mediastinal]
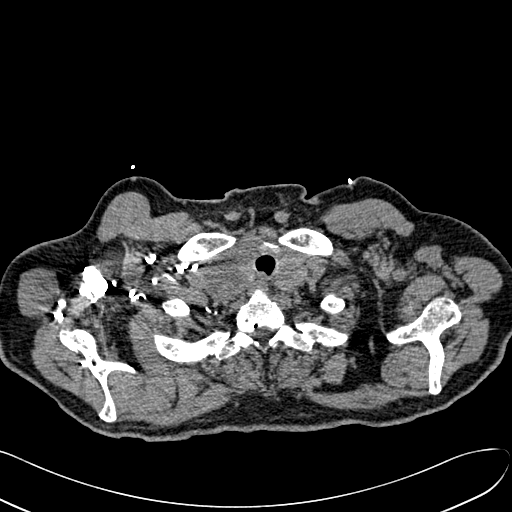

[18 of 30 positions shown; findings below may reference images not displayed]

FINDINGS: Mediastinum/Lymph Nodes: There is nodular enlargement of the left
lobe thyroid gland measures 2.6 cm. Nodular prominence right lobe of
thyroid gland measures 3 cm. Further correlation with thyroid gland
ultrasound is recommended. Atherosclerotic calcifications of
thoracic aorta. The study is of excellent technical quality. No
pulmonary embolus. Atherosclerotic calcifications of coronary
arteries. No pericardial effusion. Heart size within normal limits.
No mediastinal hematoma or adenopathy.

Lungs/Pleura: There is no pleural thickening or pleural effusion.
Images of the lung parenchyma shows no acute infiltrate or pulmonary
edema. There is no bronchiectasis. No emphysematous changes. No
fibrotic changes. No pneumothorax.

Upper abdomen: The visualized upper abdomen shows no adrenal gland
mass. The patient is status post cholecystectomy. Visualized
pancreas is unremarkable. Colonic diverticulosis. No definite
evidence of acute diverticulitis in visualized abdomen.

Musculoskeletal: Sagittal images of the spine shows degenerative
changes thoracic spine. Sagittal view of the sternum is
unremarkable. No destructive rib lesions are noted.

Review of the MIP images confirms the above findings.
IMPRESSION: 1. There is nodular prominence bilateral lobe of thyroid gland.
Further correlation with thyroid gland ultrasound is recommended.
2. No mediastinal hematoma or adenopathy.
3. Atherosclerotic calcifications of thoracic aorta and coronary
arteries.
4. No acute infiltrate or pulmonary edema.
5. No pulmonary embolus is noted.
6. Degenerative changes thoracic spine.
7. Status postcholecystectomy.
8.

## 2017-06-22 ENCOUNTER — Encounter: Payer: Self-pay | Admitting: Ophthalmology

## 2020-01-14 ENCOUNTER — Ambulatory Visit: Payer: Medicare Other
